# Patient Record
Sex: Male | Born: 1970 | Race: Black or African American | Hispanic: No | Marital: Single | Smoking: Current every day smoker
Health system: Southern US, Community
[De-identification: ages and names within clinical notes are randomized; demographics above are authoritative.]

## PROBLEM LIST (undated history)

## (undated) DIAGNOSIS — I10 Essential (primary) hypertension: Secondary | ICD-10-CM

## (undated) DIAGNOSIS — E119 Type 2 diabetes mellitus without complications: Secondary | ICD-10-CM

---

## 2019-05-06 ENCOUNTER — Other Ambulatory Visit: Payer: Self-pay

## 2019-05-06 ENCOUNTER — Emergency Department (HOSPITAL_COMMUNITY): Payer: No Typology Code available for payment source

## 2019-05-06 ENCOUNTER — Emergency Department (HOSPITAL_COMMUNITY)
Admission: EM | Admit: 2019-05-06 | Discharge: 2019-05-06 | Disposition: A | Discharge status: Home / Self Care | Payer: No Typology Code available for payment source | Attending: Emergency Medicine | Admitting: Emergency Medicine

## 2019-05-06 ENCOUNTER — Encounter (HOSPITAL_COMMUNITY): Payer: Self-pay | Admitting: Emergency Medicine

## 2019-05-06 DIAGNOSIS — F17210 Nicotine dependence, cigarettes, uncomplicated: Secondary | ICD-10-CM | POA: Insufficient documentation

## 2019-05-06 DIAGNOSIS — I10 Essential (primary) hypertension: Secondary | ICD-10-CM | POA: Insufficient documentation

## 2019-05-06 DIAGNOSIS — R10817 Generalized abdominal tenderness: Secondary | ICD-10-CM | POA: Diagnosis not present

## 2019-05-06 DIAGNOSIS — Z041 Encounter for examination and observation following transport accident: Secondary | ICD-10-CM | POA: Diagnosis present

## 2019-05-06 DIAGNOSIS — E119 Type 2 diabetes mellitus without complications: Secondary | ICD-10-CM | POA: Insufficient documentation

## 2019-05-06 DIAGNOSIS — M899 Disorder of bone, unspecified: Secondary | ICD-10-CM | POA: Insufficient documentation

## 2019-05-06 HISTORY — DX: Essential (primary) hypertension: I10

## 2019-05-06 HISTORY — DX: Type 2 diabetes mellitus without complications: E11.9

## 2019-05-06 LAB — CBC
HCT: 45.3 % (ref 39.0–52.0)
Hemoglobin: 15.1 g/dL (ref 13.0–17.0)
MCH: 30.3 pg (ref 26.0–34.0)
MCHC: 33.3 g/dL (ref 30.0–36.0)
MCV: 90.8 fL (ref 80.0–100.0)
Platelets: 405 10*3/uL — ABNORMAL HIGH (ref 150–400)
RBC: 4.99 MIL/uL (ref 4.22–5.81)
RDW: 11.4 % — ABNORMAL LOW (ref 11.5–15.5)
WBC: 13.7 10*3/uL — ABNORMAL HIGH (ref 4.0–10.5)
nRBC: 0 % (ref 0.0–0.2)

## 2019-05-06 LAB — COMPREHENSIVE METABOLIC PANEL
ALT: 18 U/L (ref 0–44)
AST: 25 U/L (ref 15–41)
Albumin: 3.8 g/dL (ref 3.5–5.0)
Alkaline Phosphatase: 105 U/L (ref 38–126)
Anion gap: 12 (ref 5–15)
BUN: 11 mg/dL (ref 6–20)
CO2: 24 mmol/L (ref 22–32)
Calcium: 9.1 mg/dL (ref 8.9–10.3)
Chloride: 102 mmol/L (ref 98–111)
Creatinine, Ser: 1.12 mg/dL (ref 0.61–1.24)
GFR calc Af Amer: >60 mL/min (ref >60)
GFR calc non Af Amer: >60 mL/min (ref >60)
Glucose, Bld: 120 mg/dL — ABNORMAL HIGH (ref 70–99)
Potassium: 4.6 mmol/L (ref 3.5–5.1)
Sodium: 138 mmol/L (ref 135–145)
Total Bilirubin: 0.8 mg/dL (ref 0.3–1.2)
Total Protein: 6.6 g/dL (ref 6.5–8.1)

## 2019-05-06 LAB — I-STAT CHEM 8, ED
BUN: 13 mg/dL (ref 6–20)
Calcium, Ion: 1.08 mmol/L — ABNORMAL LOW (ref 1.15–1.40)
Chloride: 104 mmol/L (ref 98–111)
Creatinine, Ser: 1.1 mg/dL (ref 0.61–1.24)
Glucose, Bld: 118 mg/dL — ABNORMAL HIGH (ref 70–99)
HCT: 44 % (ref 39.0–52.0)
Hemoglobin: 15 g/dL (ref 13.0–17.0)
Potassium: 4.2 mmol/L (ref 3.5–5.1)
Sodium: 138 mmol/L (ref 135–145)
TCO2: 29 mmol/L (ref 22–32)

## 2019-05-06 LAB — PROTIME-INR
INR: 0.9 (ref 0.8–1.2)
Prothrombin Time: 12.2 seconds (ref 11.4–15.2)

## 2019-05-06 LAB — URINALYSIS, ROUTINE W REFLEX MICROSCOPIC
Bilirubin Urine: NEGATIVE
Glucose, UA: NEGATIVE mg/dL
Hgb urine dipstick: NEGATIVE
Ketones, ur: NEGATIVE mg/dL
Leukocytes,Ua: NEGATIVE
Nitrite: NEGATIVE
Protein, ur: NEGATIVE mg/dL
Specific Gravity, Urine: 1.004 — ABNORMAL LOW (ref 1.005–1.030)
pH: 8 (ref 5.0–8.0)

## 2019-05-06 LAB — RAPID URINE DRUG SCREEN, HOSP PERFORMED
Amphetamines: NOT DETECTED
Barbiturates: NOT DETECTED
Benzodiazepines: NOT DETECTED
Cocaine: NOT DETECTED
Opiates: NOT DETECTED
Tetrahydrocannabinol: NOT DETECTED

## 2019-05-06 LAB — ETHANOL: Alcohol, Ethyl (B): <10 mg/dL (ref <10)

## 2019-05-06 MED ORDER — SODIUM CHLORIDE 0.9 % IV BOLUS
1000.0000 mL | Freq: Once | INTRAVENOUS | Status: AC
  Administered 2019-05-06: 1000 mL via INTRAVENOUS

## 2019-05-06 MED ORDER — IOHEXOL 300 MG/ML  SOLN
100.0000 mL | Freq: Once | INTRAMUSCULAR | Status: AC | PRN
  Administered 2019-05-06: 100 mL via INTRAVENOUS

## 2019-05-06 MED ORDER — NALOXONE HCL 0.4 MG/ML IJ SOLN
0.4000 mg | Freq: Once | INTRAMUSCULAR | Status: AC
  Administered 2019-05-06: 0.4 mg via INTRAVENOUS
  Filled 2019-05-06: qty 1

## 2019-05-06 NOTE — ED Notes (Signed)
Patient verbalizes understanding of discharge instructions. Opportunity for questioning and answers were provided. Armband removed by staff, pt discharged from ED.   Pt called a cab for transportation home.

## 2019-05-06 NOTE — ED Notes (Signed)
Spoke with Main Lab, UDS in process.

## 2019-05-06 NOTE — ED Notes (Signed)
Patient transported to CT 

## 2019-05-06 NOTE — Discharge Instructions (Signed)
Take tylenol, motrin for pain   Expect to be stiff and sore  You have a lesion in your right thigh area that needs follow up   See your doctor   Return to ER if you have headaches, vomiting, abdominal pain, fever.

## 2019-05-06 NOTE — ED Notes (Signed)
Pt currently on phone with friend.

## 2019-05-06 NOTE — ED Notes (Signed)
Pt refused wheelchair to door.
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
## 2020-12-11 ENCOUNTER — Emergency Department (HOSPITAL_COMMUNITY): Payer: Self-pay

## 2020-12-11 ENCOUNTER — Emergency Department (HOSPITAL_COMMUNITY)
Admission: EM | Admit: 2020-12-11 | Discharge: 2020-12-11 | Disposition: A | Discharge status: Home / Self Care | Payer: Self-pay | Attending: Emergency Medicine | Admitting: Emergency Medicine

## 2020-12-11 DIAGNOSIS — Z7984 Long term (current) use of oral hypoglycemic drugs: Secondary | ICD-10-CM | POA: Insufficient documentation

## 2020-12-11 DIAGNOSIS — R739 Hyperglycemia, unspecified: Secondary | ICD-10-CM

## 2020-12-11 DIAGNOSIS — L03021 Acute lymphangitis of right finger: Secondary | ICD-10-CM

## 2020-12-11 DIAGNOSIS — I10 Essential (primary) hypertension: Secondary | ICD-10-CM | POA: Insufficient documentation

## 2020-12-11 DIAGNOSIS — E1165 Type 2 diabetes mellitus with hyperglycemia: Secondary | ICD-10-CM | POA: Insufficient documentation

## 2020-12-11 DIAGNOSIS — L03011 Cellulitis of right finger: Secondary | ICD-10-CM | POA: Insufficient documentation

## 2020-12-11 LAB — COMPREHENSIVE METABOLIC PANEL
ALT: 15 U/L (ref 0–44)
AST: 17 U/L (ref 15–41)
Albumin: 3.5 g/dL (ref 3.5–5.0)
Alkaline Phosphatase: 187 U/L — ABNORMAL HIGH (ref 38–126)
Anion gap: 10 (ref 5–15)
BUN: 8 mg/dL (ref 6–20)
CO2: 26 mmol/L (ref 22–32)
Calcium: 9 mg/dL (ref 8.9–10.3)
Chloride: 91 mmol/L — ABNORMAL LOW (ref 98–111)
Creatinine, Ser: 0.92 mg/dL (ref 0.61–1.24)
GFR, Estimated: >60 mL/min (ref >60)
Glucose, Bld: 564 mg/dL (ref 70–99)
Potassium: 4.2 mmol/L (ref 3.5–5.1)
Sodium: 127 mmol/L — ABNORMAL LOW (ref 135–145)
Total Bilirubin: 0.8 mg/dL (ref 0.3–1.2)
Total Protein: 6.8 g/dL (ref 6.5–8.1)

## 2020-12-11 LAB — CBC WITH DIFFERENTIAL/PLATELET
Abs Immature Granulocytes: 0.1 10*3/uL — ABNORMAL HIGH (ref 0.00–0.07)
Basophils Absolute: 0.1 10*3/uL (ref 0.0–0.1)
Basophils Relative: 0 %
Eosinophils Absolute: 0.2 10*3/uL (ref 0.0–0.5)
Eosinophils Relative: 1 %
HCT: 43.6 % (ref 39.0–52.0)
Hemoglobin: 14.9 g/dL (ref 13.0–17.0)
Immature Granulocytes: 1 %
Lymphocytes Relative: 12 %
Lymphs Abs: 2.2 10*3/uL (ref 0.7–4.0)
MCH: 28.9 pg (ref 26.0–34.0)
MCHC: 34.2 g/dL (ref 30.0–36.0)
MCV: 84.5 fL (ref 80.0–100.0)
Monocytes Absolute: 1.2 10*3/uL — ABNORMAL HIGH (ref 0.1–1.0)
Monocytes Relative: 6 %
Neutro Abs: 15 10*3/uL — ABNORMAL HIGH (ref 1.7–7.7)
Neutrophils Relative %: 80 %
Platelets: 311 10*3/uL (ref 150–400)
RBC: 5.16 MIL/uL (ref 4.22–5.81)
RDW: 10.9 % — ABNORMAL LOW (ref 11.5–15.5)
WBC: 18.8 10*3/uL — ABNORMAL HIGH (ref 4.0–10.5)
nRBC: 0 % (ref 0.0–0.2)

## 2020-12-11 LAB — CBG MONITORING, ED: Glucose-Capillary: 327 mg/dL — ABNORMAL HIGH (ref 70–99)

## 2020-12-11 MED ORDER — SULFAMETHOXAZOLE-TRIMETHOPRIM 800-160 MG PO TABS: 1.0000 | ORAL_TABLET | Freq: Two times a day (BID) | ORAL | 0 refills | Status: AC

## 2020-12-11 MED ORDER — METFORMIN HCL 500 MG PO TABS
500.0000 mg | ORAL_TABLET | Freq: Once | ORAL | Status: AC
  Administered 2020-12-11: 500 mg via ORAL
  Filled 2020-12-11: qty 1

## 2020-12-11 MED ORDER — INSULIN ASPART 100 UNIT/ML IJ SOLN
8.0000 [IU] | Freq: Once | INTRAMUSCULAR | Status: AC
  Administered 2020-12-11: 8 [IU] via SUBCUTANEOUS

## 2020-12-11 MED ORDER — METFORMIN HCL 500 MG PO TABS: 500.0000 mg | ORAL_TABLET | Freq: Two times a day (BID) | ORAL | 0 refills | Status: AC

## 2020-12-11 MED ORDER — LIDOCAINE HCL (PF) 1 % IJ SOLN
5.0000 mL | Freq: Once | INTRAMUSCULAR | Status: AC
  Administered 2020-12-11: 5 mL
  Filled 2020-12-11: qty 5

## 2020-12-11 MED ORDER — SULFAMETHOXAZOLE-TRIMETHOPRIM 800-160 MG PO TABS
1.0000 | ORAL_TABLET | Freq: Once | ORAL | Status: AC
  Administered 2020-12-11: 1 via ORAL
  Filled 2020-12-11: qty 1

## 2020-12-11 NOTE — ED Notes (Signed)
Pt discharged and ambulated out of the ED without difficulty. 

## 2020-12-11 NOTE — ED Notes (Signed)
Patient stepped outside  

## 2020-12-11 NOTE — ED Notes (Signed)
Lab called, glucose 564

## 2020-12-11 NOTE — ED Triage Notes (Signed)
Right index finger swelling with greenish discoloration around distal part of finger noted.   Hx of diabetes.

## 2020-12-11 NOTE — ED Triage Notes (Signed)
I & D tray by bedside.

## 2020-12-11 NOTE — ED Provider Notes (Addendum)
[facility]  CSN: [id number] Arrival date & time: 12/11/20 0153  Chief Complaint(s) No chief complaint on file.  HPI Hall [place] Dustin Zhang is a 50 y.o. male patient with a history of hypertension and diabetes here for right finger pain and swelling. Ongoing for 3 days. Gradually worsening since onset. He denies any trauma. Reports that he thought it was an ingrown nail that might be infected. Noted skin changes yesterday prompting his visit. Pain is mild to moderate soreness.  Worse with palpation.  Alleviated by mobility. No fevers or chills.  Still able to range but limited due to swelling.  Patient reports that he has not been taking his medication for over 7 months.  Reports that he was previously incarcerated for numerous years and was getting his medication then. Reports that he was on metformin for his diabetes.   HPI  Past Medical History Past Medical History:  Diagnosis Date   Diabetes mellitus without complication (HCC)    Hypertension    There are no problems to display for this patient.  Home Medication(s) Prior to Admission medications   Medication Sig Start Date End Date Taking? Authorizing Provider  acetaminophen (TYLENOL) 500 MG tablet Take 500 mg by mouth every 6 (six) hours as needed for moderate pain.   Yes [provider]  metFORMIN (GLUCOPHAGE) 500 MG tablet Take 1 tablet (500 mg total) by mouth 2 (two) times daily with a meal. 12/11/20 01/10/21 Yes Arnie Maiolo, Amadeo GarnetPedro Eduardo, MD  sulfamethoxazole-trimethoprim (BACTRIM DS) 800-160 MG tablet Take 1 tablet by mouth 2 (two) times daily for 7 days. 12/11/20 12/18/20 Yes Journei Thomassen, Amadeo GarnetPedro Eduardo, MD                                                                                                                                    Past Surgical History No past surgical history on file. Family History No family history on file.  Social History Social History   Tobacco  Use   Smoking status: Every Day    Packs/day: 1.00    Types: Cigarettes   Smokeless tobacco: Never  Vaping Use   Vaping Use: Never used  Substance Use Topics   Alcohol use: Never   Drug use: Yes    Types: Marijuana   Allergies Patient has no known allergies.  Review of Systems Review of Systems All other systems are reviewed and are negative for acute change except as noted in the HPI  Physical Exam Vital Signs  I have reviewed the triage vital signs BP (!) 137/99   Pulse 92   Temp 98.8 F (37.1 C) (Oral)   Resp 14   SpO2 96%   Physical Exam Vitals reviewed.  Constitutional:      General: He is not in acute distress.    Appearance: He is well-developed. He is not diaphoretic.  HENT:     Head: Normocephalic and atraumatic.  Right Ear: External ear normal.     Left Ear: External ear normal.     Nose: Nose normal.     Mouth/Throat:     Mouth: Mucous membranes are moist.  Eyes:     General: No scleral icterus.    Conjunctiva/sclera: Conjunctivae normal.  Neck:     Trachea: Phonation normal.  Cardiovascular:     Rate and Rhythm: Normal rate and regular rhythm.  Pulmonary:     Effort: Pulmonary effort is normal. No respiratory distress.     Breath sounds: No stridor.  Abdominal:     General: There is no distension.  Musculoskeletal:        General: Normal range of motion.     Right hand: Swelling and tenderness present. Normal strength. Normal sensation. Normal pulse.     Left hand: No swelling or tenderness. Normal strength. Normal sensation. Normal pulse.       Hands:     Cervical back: Normal range of motion.     Comments: Hyperemic streaking tracking in linear pattern up the forearm into the [facility] Nontender. Palpable cord.  Neurological:     Mental Status: He is alert and oriented to person, place, and time.  Psychiatric:        Behavior: Behavior normal.    ED Results and Treatments Labs (all labs ordered are listed, but only abnormal results are  displayed) Labs Reviewed  CBC WITH DIFFERENTIAL/PLATELET - Abnormal; Notable for the following components:      Result Value   WBC 18.8 (*)    RDW 10.9 (*)    Neutro Abs 15.0 (*)    Monocytes Absolute 1.2 (*)    Abs Immature Granulocytes 0.10 (*)    All other components within normal limits  COMPREHENSIVE METABOLIC PANEL - Abnormal; Notable for the following components:   Sodium 127 (*)    Chloride 91 (*)    Glucose, Bld 564 (*)    Alkaline Phosphatase 187 (*)    All other components within normal limits  CBG MONITORING, ED - Abnormal; Notable for the following components:   Glucose-Capillary 327 (*)    All other components within normal limits                                                                                                                         EKG  EKG Interpretation  Date/Time:    Ventricular Rate:    PR Interval:    QRS Duration:   QT Interval:    QTC Calculation:   R Axis:     Text Interpretation:         Radiology DG Finger Index Right  Result Date: 12/11/2020 CLINICAL DATA:  Second digit swelling EXAM: RIGHT INDEX FINGER 2+V COMPARISON:  None. FINDINGS: No acute bony abnormality is noted. Soft tissue swelling of the second digit is noted consistent with the given clinical history. No foreign body is noted. IMPRESSION: Soft tissue swelling without acute bony abnormality. Electronically Signed  By: Alcide Clever M.D.   On: 12/11/2020 03:07    Pertinent labs & imaging results that were available during my care of the patient were reviewed by me and considered in my medical decision making (see MDM for details).  Medications Ordered in ED Medications  insulin aspart (novoLOG) injection 8 Units (8 Units Subcutaneous Given 12/11/20 0504)  lidocaine (PF) (XYLOCAINE) 1 % injection 5 mL (5 mLs Infiltration Given by Other 12/11/20 0530)  sulfamethoxazole-trimethoprim (BACTRIM DS) 800-160 MG per tablet 1 tablet (1 tablet Oral Given 12/11/20 0703)  metFORMIN  (GLUCOPHAGE) tablet 500 mg (500 mg Oral Given 12/11/20 0703)                                                                                                                                     Procedures .Marland KitchenIncision and Drainage  Date/Time: 12/11/2020 7:05 AM Performed by: Nira Conn, MD Authorized by: Nira Conn, MD   Consent:    Consent obtained:  Verbal   Consent given by:  Patient   Risks discussed:  Bleeding, incomplete drainage and infection   Alternatives discussed:  Delayed treatment Universal protocol:    Immediately prior to procedure, a time out was called: yes     Patient identity confirmed:  Verbally with patient and arm band Location:    Type:  Abscess   Location:  Upper extremity   Upper extremity location:  Finger   Finger location:  R index finger Pre-procedure details:    Skin preparation:  Povidone-iodine Anesthesia:    Anesthesia method:  Local infiltration   Local anesthetic:  Lidocaine 1% w/o epi Procedure type:    Complexity:  Complex Procedure details:    Incision types:  Single straight   Incision depth:  Subfascial   Wound management:  Probed and deloculated and extensive cleaning   Drainage:  Purulent and bloody   Drainage amount:  Moderate   Wound treatment:  Wound left open   Packing materials:  None Post-procedure details:    Procedure completion:  Tolerated well, no immediate complications  (including critical care time)  Medical Decision Making / ED Course I have reviewed the nursing notes for this encounter and the patient's prior records (if available in EHR or on provided paperwork).  Jakeem Grape was evaluated in Emergency Department on 12/11/2020 for the symptoms described in the history of present illness. He was evaluated in the context of the global COVID-19 pandemic, which necessitated consideration that the patient might be at risk for infection with the SARS-CoV-2 virus that causes COVID-19. Institutional  protocols and algorithms that pertain to the evaluation of patients at risk for COVID-19 are in a state of rapid change based on information released by regulatory bodies including the CDC and federal and state organizations. These policies and algorithms were followed during the patient's care in the ED.     Finger infection, most likely felon. With lymphangitis vs  superficial thrombophlebitis. Not consistent with flexor tenosynovitis. Will require incision and drainage. Will also treat with antibiotics given patient uncontrolled diabetes.   Pertinent labs & imaging results that were available during my care of the patient were reviewed by me and considered in my medical decision making:  Screening labs notable for hyperglycemia without evidence of DKA.  Patient given subcu insulin. With Rx Metformin  I&D as above.  Given first dose of antibiotics here.  Transitional care team consultation for assistance finding a PCP.   Final Clinical Impression(s) / ED Diagnoses Final diagnoses:  Felon with lymphangitis, right  Hyperglycemia   The patient appears reasonably screened and/or stabilized for discharge and I doubt any other medical condition or other [facility] requiring further screening, evaluation, or treatment in the ED at this time prior to discharge. Safe for discharge with strict return precautions.  Disposition: Discharge  Condition: Good  I have discussed the results, Dx and Tx plan with the patient/family who expressed understanding and agree(s) with the plan. Discharge instructions discussed at length. The patient/family was given strict return precautions who verbalized understanding of the instructions. No further questions at time of discharge.    ED Discharge Orders          Ordered    sulfamethoxazole-trimethoprim (BACTRIM DS) 800-160 MG tablet  2 times daily        12/11/20 0705    metFORMIN (GLUCOPHAGE) 500 MG tablet  2 times daily with meals        12/11/20 0705               Follow Up: [facility] EMERGENCY DEPARTMENT [address] [id number] mc [place] [address] [phone] Go to  for wound re-check in 2-3 days     This chart was dictated using voice recognition software.  Despite best efforts to proofread,  errors can occur which can change the documentation meaning.    Nira Conn, MD 12/11/20 [phone]

## 2020-12-11 NOTE — Discharge Instructions (Addendum)
Please your antibiotics as prescribed. Please soak your finger in warm soapy water 2-3 times a day for 5 days starting tomorrow. Please keep the wound clean. Wear gloves that do not allow dirty water to get near the wound while at work.  I will prescribe you metformin for your diabetes. Transitional care team will call you to help establish care with a primary care provider.  Please return in 2 to 3 days for wound check of your finger.  For pain control you may take at 1000 mg of Tylenol every 8 hours scheduled.
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
## 2020-12-19 ENCOUNTER — Emergency Department (HOSPITAL_COMMUNITY)
Admission: EM | Admit: 2020-12-19 | Discharge: 2020-12-19 | Discharge status: Against Medical Advice | Payer: Self-pay | Attending: Student | Admitting: Student

## 2020-12-19 ENCOUNTER — Encounter (HOSPITAL_COMMUNITY): Payer: Self-pay | Admitting: *Deleted

## 2020-12-19 ENCOUNTER — Emergency Department (HOSPITAL_COMMUNITY): Payer: Self-pay

## 2020-12-19 ENCOUNTER — Other Ambulatory Visit: Payer: Self-pay

## 2020-12-19 DIAGNOSIS — I10 Essential (primary) hypertension: Secondary | ICD-10-CM | POA: Insufficient documentation

## 2020-12-19 DIAGNOSIS — M86141 Other acute osteomyelitis, right hand: Secondary | ICD-10-CM | POA: Insufficient documentation

## 2020-12-19 DIAGNOSIS — F17210 Nicotine dependence, cigarettes, uncomplicated: Secondary | ICD-10-CM | POA: Insufficient documentation

## 2020-12-19 DIAGNOSIS — R739 Hyperglycemia, unspecified: Secondary | ICD-10-CM

## 2020-12-19 DIAGNOSIS — E1165 Type 2 diabetes mellitus with hyperglycemia: Secondary | ICD-10-CM | POA: Insufficient documentation

## 2020-12-19 DIAGNOSIS — Z20822 Contact with and (suspected) exposure to covid-19: Secondary | ICD-10-CM | POA: Insufficient documentation

## 2020-12-19 DIAGNOSIS — Z7984 Long term (current) use of oral hypoglycemic drugs: Secondary | ICD-10-CM | POA: Insufficient documentation

## 2020-12-19 DIAGNOSIS — L03019 Cellulitis of unspecified finger: Secondary | ICD-10-CM | POA: Insufficient documentation

## 2020-12-19 DIAGNOSIS — M869 Osteomyelitis, unspecified: Secondary | ICD-10-CM

## 2020-12-19 LAB — CBC WITH DIFFERENTIAL/PLATELET
Abs Immature Granulocytes: 0.09 10*3/uL — ABNORMAL HIGH (ref 0.00–0.07)
Basophils Absolute: 0.1 10*3/uL (ref 0.0–0.1)
Basophils Relative: 1 %
Eosinophils Absolute: 0.3 10*3/uL (ref 0.0–0.5)
Eosinophils Relative: 2 %
HCT: 44.1 % (ref 39.0–52.0)
Hemoglobin: 15.1 g/dL (ref 13.0–17.0)
Immature Granulocytes: 1 %
Lymphocytes Relative: 27 %
Lymphs Abs: 3.8 10*3/uL (ref 0.7–4.0)
MCH: 29.2 pg (ref 26.0–34.0)
MCHC: 34.2 g/dL (ref 30.0–36.0)
MCV: 85.3 fL (ref 80.0–100.0)
Monocytes Absolute: 0.8 10*3/uL (ref 0.1–1.0)
Monocytes Relative: 6 %
Neutro Abs: 9.2 10*3/uL — ABNORMAL HIGH (ref 1.7–7.7)
Neutrophils Relative %: 63 %
Platelets: 401 10*3/uL — ABNORMAL HIGH (ref 150–400)
RBC: 5.17 MIL/uL (ref 4.22–5.81)
RDW: 10.8 % — ABNORMAL LOW (ref 11.5–15.5)
WBC: 14.3 10*3/uL — ABNORMAL HIGH (ref 4.0–10.5)
nRBC: 0 % (ref 0.0–0.2)

## 2020-12-19 LAB — BASIC METABOLIC PANEL WITH GFR
Anion gap: 13 (ref 5–15)
BUN: 6 mg/dL (ref 6–20)
CO2: 25 mmol/L (ref 22–32)
Calcium: 9.2 mg/dL (ref 8.9–10.3)
Chloride: 90 mmol/L — ABNORMAL LOW (ref 98–111)
Creatinine, Ser: 0.85 mg/dL (ref 0.61–1.24)
GFR, Estimated: >60 mL/min
Glucose, Bld: 596 mg/dL (ref 70–99)
Potassium: 4.3 mmol/L (ref 3.5–5.1)
Sodium: 128 mmol/L — ABNORMAL LOW (ref 135–145)

## 2020-12-19 LAB — RESP PANEL BY RT-PCR (FLU A&B, COVID) ARPGX2
Influenza A by PCR: NEGATIVE
Influenza B by PCR: NEGATIVE
SARS Coronavirus 2 by RT PCR: NEGATIVE

## 2020-12-19 LAB — CBG MONITORING, ED: Glucose-Capillary: 537 mg/dL (ref 70–99)

## 2020-12-19 MED ORDER — BUPIVACAINE HCL (PF) 0.5 % IJ SOLN
10.0000 mL | Freq: Once | INTRAMUSCULAR | Status: AC
  Administered 2020-12-19: 10 mL
  Filled 2020-12-19: qty 10
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
## 2020-12-19 NOTE — ED Triage Notes (Signed)
Pt presents as a followup; seen last week week for infection to R index finger. Prescribed penicillin without improvement of finger.
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
## 2020-12-19 NOTE — ED Provider Notes (Signed)
[facility] EMERGENCY DEPARTMENT Provider Note   CSN: [id number] Arrival date & time: 12/19/20  0304     History Chief Complaint  Patient presents with   Wound Infection    Dustin Zhang is a 50 y.o. male.  Dustin Zhang is a 50 y.o. male with history of hypertension and poorly controlled diabetes, who presents with complaints of finger infection.  Patient was seen in the ED on 8/6 diagnosed with a felon which was drained at the bedside and treated with Bactrim. Pt was instructed to return to the ED in 2-3 days, but has not followed up.  Patient also prescribed metformin for diabetes.  He reports despite taking antibiotics for metformin and soaking the finger it has become increasingly swollen and painful and now the fingertip is very discolored.  It is very painful and patient reports decreased movement.   No fevers or chills. Pt is a poorly controlled diabetic, only on metformin for glycemic control.     The history is provided by the patient and medical records.      Past Medical History:  Diagnosis Date   Diabetes mellitus without complication (HCC)    Hypertension     There are no problems to display for this patient.   History reviewed. No pertinent surgical history.     No family history on file.  Social History   Tobacco Use   Smoking status: Every Day    Packs/day: 1.00    Types: Cigarettes   Smokeless tobacco: Never  Vaping Use   Vaping Use: Never used  Substance Use Topics   Alcohol use: Never   Drug use: Yes    Types: Marijuana    Home Medications Prior to Admission medications   Medication Sig Start Date End Date Taking? Authorizing Provider  acetaminophen (TYLENOL) 500 MG tablet Take 500 mg by mouth every 6 (six) hours as needed for moderate pain.    [provider]  metFORMIN (GLUCOPHAGE) 500 MG tablet Take 1 tablet (500 mg total) by mouth 2 (two) times daily with a meal. 12/11/20 01/10/21  Cardama, Amadeo Garnet, MD     Allergies    Patient has no known allergies.  Review of Systems   Review of Systems  Constitutional:  Negative for chills and fever.  Endocrine: Positive for polydipsia and polyuria.  Musculoskeletal:  Positive for arthralgias.  Skin:  Positive for color change and wound.  Neurological:  Negative for weakness and numbness.  All other systems reviewed and are negative.  Physical Exam Updated Vital Signs BP (!) 147/98 (BP Location: Right Arm)   Pulse 92   Temp 99 F (37.2 C)   Resp 16   SpO2 100%   Physical Exam Vitals and nursing note reviewed.  Constitutional:      General: He is not in acute distress.    Appearance: Normal appearance. He is well-developed. He is not diaphoretic.  HENT:     Head: Normocephalic and atraumatic.  Eyes:     General:        Right eye: No discharge.        Left eye: No discharge.  Cardiovascular:     Rate and Rhythm: Normal rate and regular rhythm.     Pulses: Normal pulses.  Pulmonary:     Effort: Pulmonary effort is normal. No respiratory distress.     Breath sounds: Normal breath sounds. No wheezing or rales.  Abdominal:     General: There is no distension.  Palpations: Abdomen is soft.     Tenderness: There is no abdominal tenderness.  Musculoskeletal:        General: Swelling and tenderness present.     Comments: right index finger with prior incision from felon drainage, the fingertip is very swollen with yellow and black areas of discoloration, swelling extends down the finger with some redness, limited range of motion.  See photo below.  Skin:    General: Skin is warm and dry.     Capillary Refill: Capillary refill takes less than 2 seconds.  Neurological:     Mental Status: He is alert and oriented to person, place, and time.     Coordination: Coordination normal.     Comments: Speech is clear, able to follow commands Moves extremities without ataxia, coordination intact  Psychiatric:        Mood and Affect: Mood  normal.        Behavior: Behavior normal.     ED Results / Procedures / Treatments   Labs (all labs ordered are listed, but only abnormal results are displayed) Labs Reviewed  BASIC METABOLIC PANEL - Abnormal; Notable for the following components:      Result Value   Sodium 128 (*)    Chloride 90 (*)    Glucose, Bld 596 (*)    All other components within normal limits  CBC WITH DIFFERENTIAL/PLATELET - Abnormal; Notable for the following components:   WBC 14.3 (*)    RDW 10.8 (*)    Platelets 401 (*)    Neutro Abs 9.2 (*)    Abs Immature Granulocytes 0.09 (*)    All other components within normal limits  CBG MONITORING, ED - Abnormal; Notable for the following components:   Glucose-Capillary 537 (*)    All other components within normal limits  RESP PANEL BY RT-PCR (FLU A&B, COVID) [id number]    EKG None  Radiology DG Finger Index Right  Result Date: 12/19/2020 CLINICAL DATA:  50 year old male with right finger infection, poor healing. EXAM: RIGHT INDEX FINGER 2+V COMPARISON:  Right finger radiographs earlier this month 12/11/2020. FINDINGS: There has been interval osteolysis along the ulnar tuft of the 2nd distal phalanx. Also there is osteolysis at the radial base of the distal phalanx. Regional soft tissue swelling and soft tissue irregularity has progressed now with a new small focus of soft tissue gas. The D IP appears stable. The middle and proximal phalanges of the 2nd finger appear stable. IMPRESSION: Positive for Osteomyelitis of the 2nd distal phalanx. Increased soft tissue swelling and now trace soft tissue gas. Electronically Signed   By: Odessa Fleming M.D.   On: 12/19/2020 05:22    Procedures Procedures   Medications Ordered in ED Medications  bupivacaine (MARCAINE) 0.5 % injection 10 mL (10 mLs Infiltration Given by Other 12/19/20 1610)    ED Course  I have reviewed the triage vital signs and the nursing notes.  Pertinent labs & imaging results that were available  during my care of the patient were reviewed by me and considered in my medical decision making (see chart for details).    MDM Rules/Calculators/A&P                          50 year old male presents due to worsening finger infection, had felon drainage performed in the ED on 8/6, completed course of Bactrim, was noted to have poorly controlled diabetes at this time and reports he has been taking metformin as  prescribed.  Despite this finger not improving.  Majority of the distal fingertip has devitalized tissue with black and yellow discoloration.  Concern for worsening finger infection, will get basic labs and COVID swab for potential surgical intervention as well as x-ray to assess for potential osteomyelitis and consult hand surgery.  CBG at bedside 537, hyperglycemia likely contributing to poor wound healing and continued infection.  3:56 AM Case discussed with Dr. Janee Morn with hand surgery who reviewed photos in chart. Feels pt will likely need bedside procedure to remove epidermal cap. Requests digital block and will come in to evaluate the patient.  4:27 AM Pt told nursing staff he was going out to smoke and now staff have been unable to locate the patient. Attempted to call the patient's cell phone with no answer. Called and notified Dr. Janee Morn  Lab work significant for leukocytosis of 14.3, improved from 18.8 at previous visit.  Hyperglycemia of 596, but without anion gap and normal bicarb, no evidence of DKA.  Patient with expected hyponatremia in setting of hyperglycemia.  Dr. Janee Morn at bedside, performed digital block with Marcaine and bluntly dissected and removed large amount of devitalized tissue.  Dr. Janee Morn spent time discussing appropriate wound care with patient instructed to clean with soap and water daily apply bacitracin to exposed inner healthy tissue but to keep existing epidermis that remains clean and dry.  He will see patient in his office in 1 week for follow-up.   Defers to ED team for infection and hyperglycemia management.  Recommended admission for IV and better blood pressure control to increase chances of better wound healing as patient is currently at high risk of losing part of his finger.  Patient is agreeable to this but reports that he needs to leave to return acar and then will get ride back to the hospital later this afternoon.  I had a long discussion with patient regarding increased risk with delaying care by leaving today and returning later, patient insists that he has to leave but will come back.  X-ray was initially delayed as patient had left the department, radiology report positive for osteomyelitis of the second distal phalanx with some soft tissue gas present as well.  Notify Dr. Janee Morn of this result who reviewed x-rays as well.  Reports no change in plan, agrees with IV antibiotics and medicine admission if and when patient returns this afternoon, but no further surgical intervention at this time.  Discussed risks of leaving and not continuing.  Currently, most primarily worsening infection leading to partial or complete loss of finger.  Patient expresses understanding he is currently leaving AGAINST MEDICAL ADVICE and is alert, has full decision-making capacity, but reports he will return this afternoon with plans to be admitted to the hospital for IV antibiotics and aggressive blood sugar control.  final Clinical Impression(s) / ED Diagnoses Final diagnoses:  Felon of finger  Hyperglycemia  Osteomyelitis of right hand, unspecified type [facility])    Rx / DC Orders ED Discharge Orders     None        Legrand Rams 12/19/20 0602    Zadie Rhine, MD 12/19/20 [phone]

## 2020-12-19 NOTE — Discharge Instructions (Addendum)
You need to return to the emergency department today for admission for blood sugar control and IV antibiotics for severe finger infection.  Without getting your blood sugars under control infection will likely worsen and you could lose part of your finger.  Dr. Janee Morn wants you to keep the finger clean and dry, you should wash the wound with soap and water and then apply bacitracin ointment only to the exposed inner tissue, keep the intact outer skin of the finger dry.  Dr. Janee Morn wants to see you in follow up in his office in 1 week to see how the finger is healing.
